# Patient Record
Sex: Male | Born: 1970 | Race: White | Hispanic: No | Marital: Married | Smoking: Former smoker
Health system: Southern US, Community
[De-identification: ages and names within clinical notes are randomized; demographics above are authoritative.]

## PROBLEM LIST (undated history)

## (undated) DIAGNOSIS — R112 Nausea with vomiting, unspecified: Secondary | ICD-10-CM

## (undated) DIAGNOSIS — Z9889 Other specified postprocedural states: Secondary | ICD-10-CM

## (undated) DIAGNOSIS — N189 Chronic kidney disease, unspecified: Secondary | ICD-10-CM

---

## 1997-02-07 HISTORY — PX: WISDOM TOOTH EXTRACTION: SHX21

## 2002-02-07 HISTORY — PX: OTHER SURGICAL HISTORY: SHX169

## 2006-02-07 HISTORY — PX: APPENDECTOMY: SHX54

## 2007-02-08 DIAGNOSIS — N189 Chronic kidney disease, unspecified: Secondary | ICD-10-CM

## 2007-02-08 HISTORY — PX: LITHOTRIPSY: SUR834

## 2007-02-08 HISTORY — DX: Chronic kidney disease, unspecified: N18.9

## 2007-02-08 HISTORY — PX: CYSTOSCOPY/RETROGRADE/URETEROSCOPY/STONE EXTRACTION WITH BASKET: SHX5317

## 2009-12-10 ENCOUNTER — Encounter: Admission: RE | Admit: 2009-12-10 | Discharge: 2009-12-10 | Payer: Self-pay | Admitting: Gastroenterology

## 2011-06-06 IMAGING — CT CT ABD-PELV W/ CM
2 of 4 series · 10 of 36 positions shown, 17 images · IV contrast (READICAT/WATER & [ID] OMNI 300)
Comparison: None.

CLINICAL DATA: Left upper quadrant abdominal pain, nausea,
decreased appetite, history of kidney stones

CT ABDOMEN AND PELVIS WITH CONTRAST
TECHNIQUE: Multidetector CT imaging of the abdomen and pelvis was
performed following the standard protocol during bolus
administration of intravenous contrast.
Contrast: 100 ml Mmnipaque-RXX

[Series 3: routine abdomen · axial · 0.66mm/px · z∈[-371,-61]mm · 9 of 78 slices shown, 15 images]
[im 8/78  soft-tissue]
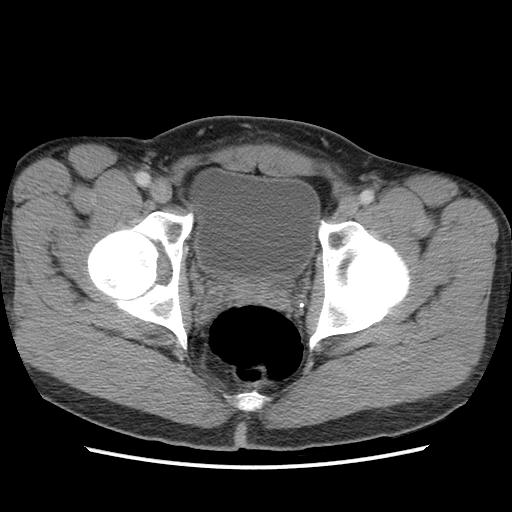
[im 8/78  bone]
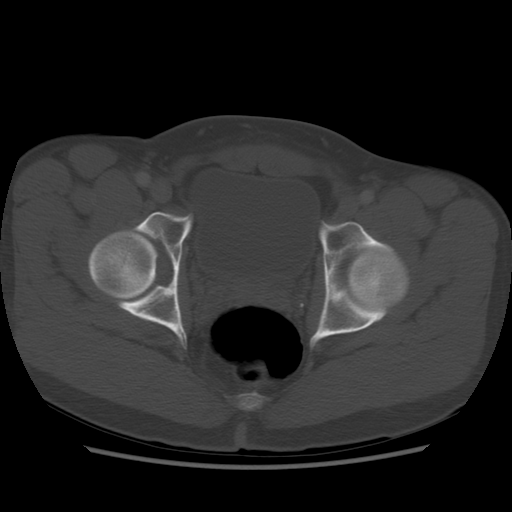
[im 16/78  soft-tissue]
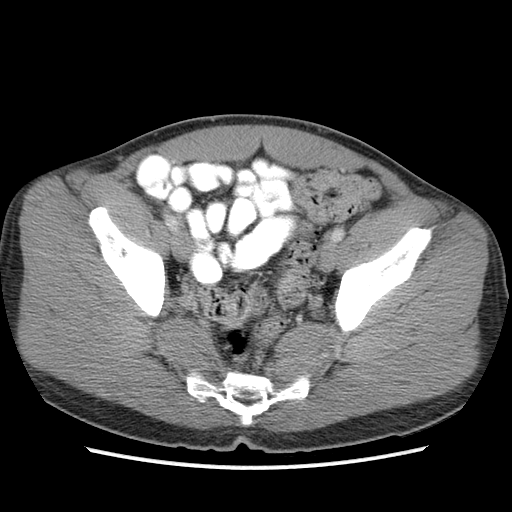
[im 24/78  soft-tissue]
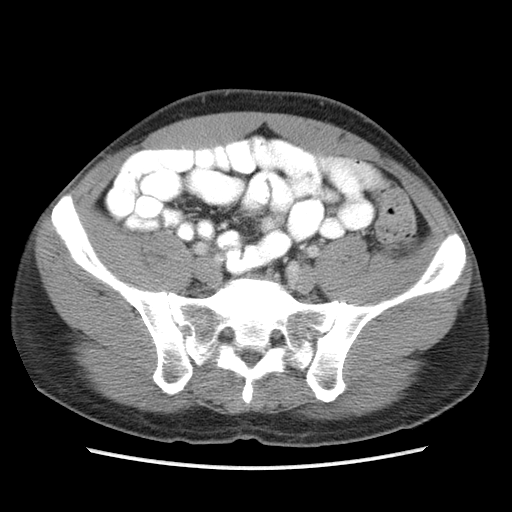
[im 31/78  soft-tissue]
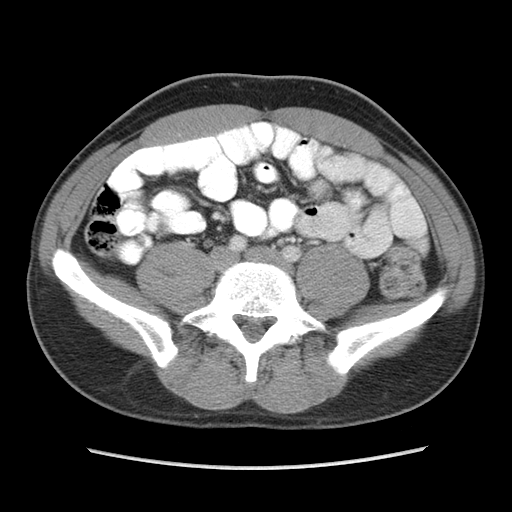
[im 39/78  soft-tissue]
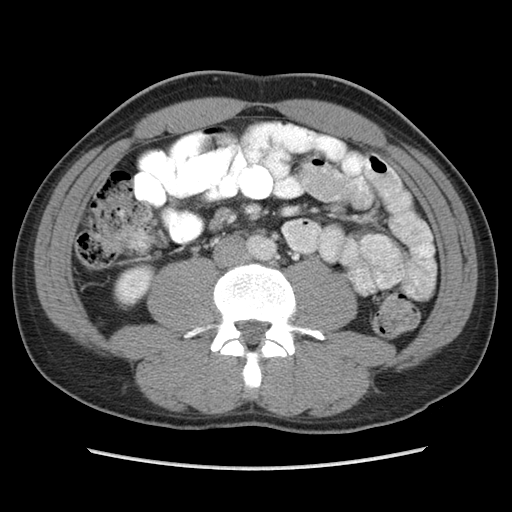
[im 47/78  soft-tissue]
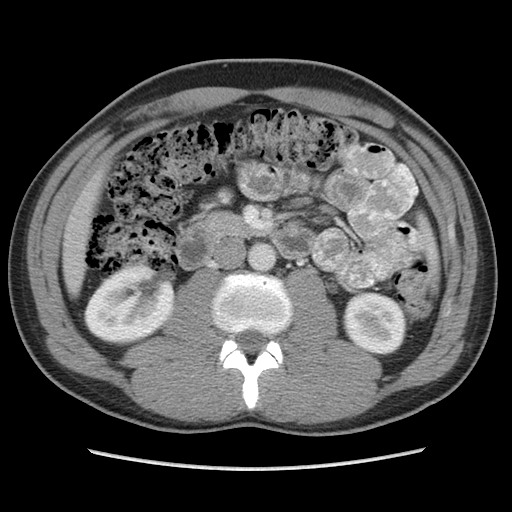
[im 47/78  lung]
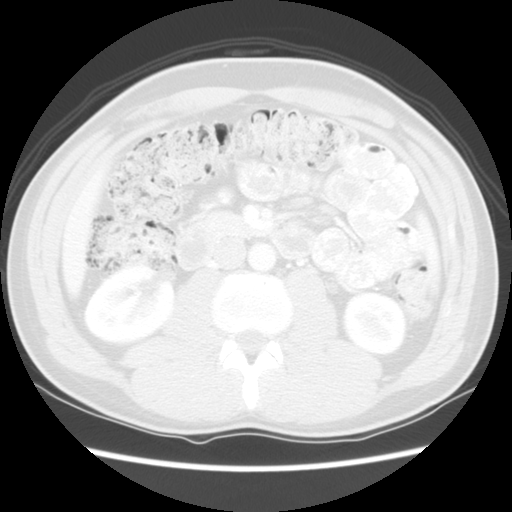
[im 54/78  soft-tissue]
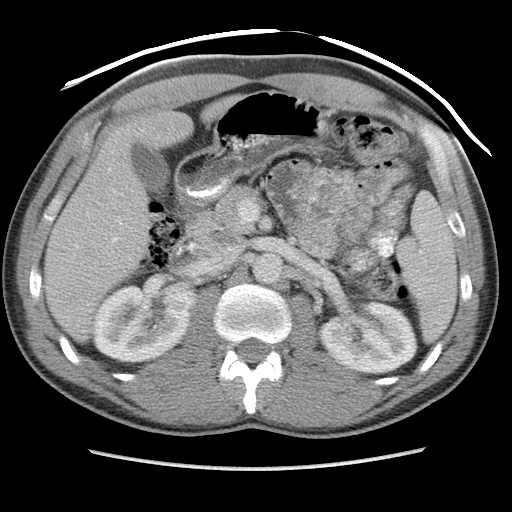
[im 54/78  lung]
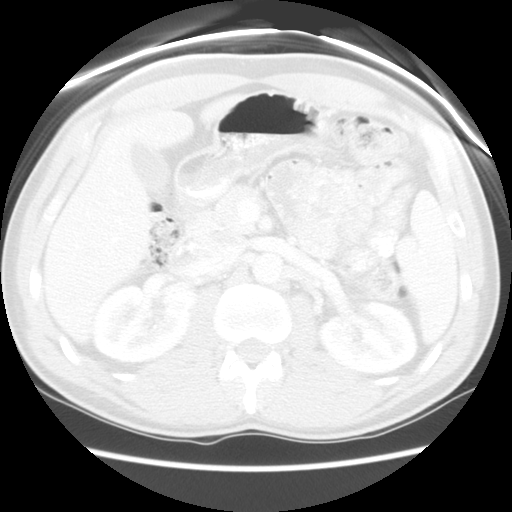
[im 62/78  soft-tissue]
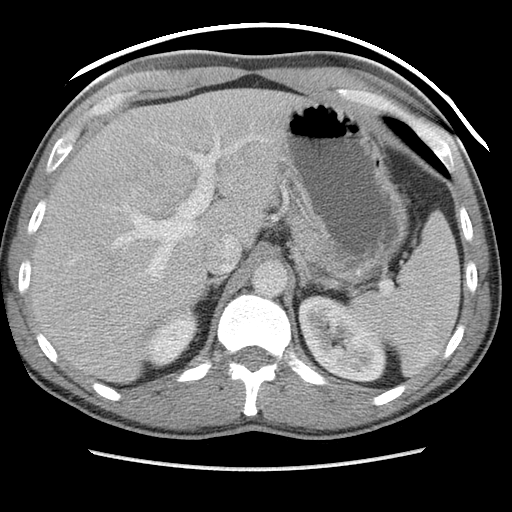
[im 62/78  lung]
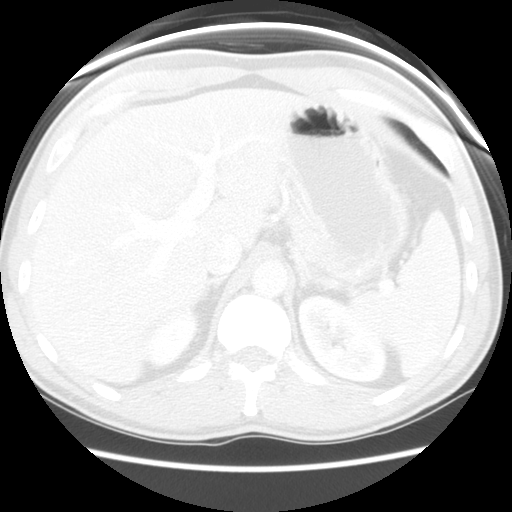
[im 70/78  soft-tissue]
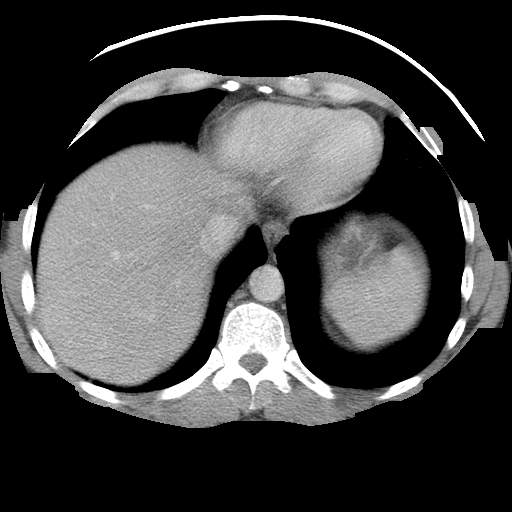
[im 70/78  lung]
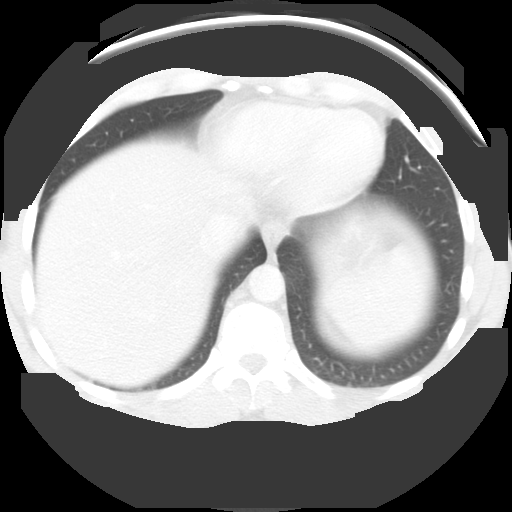
[im 70/78  bone]
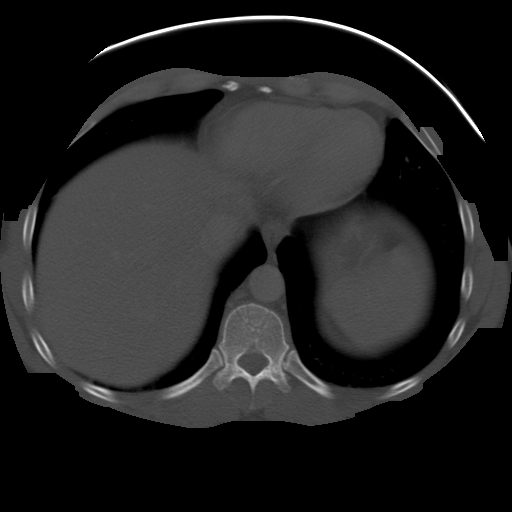

[Series 601: coronal body · coronal · 0.88mm/px · 1 of 112 slices shown, 2 images]
[im 38/112  soft-tissue]
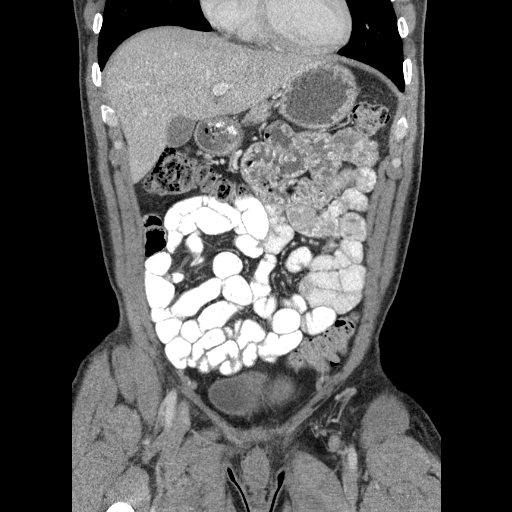
[im 38/112  bone]
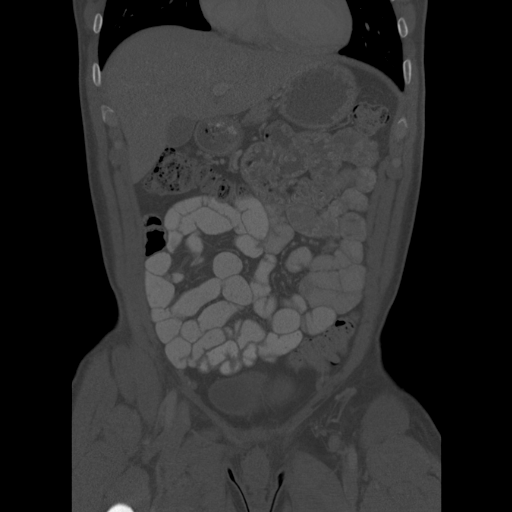

[10 of 36 positions shown; findings below may reference images not displayed]

FINDINGS: The lung bases are clear.  The liver enhances with no
focal abnormality and no ductal dilatation is seen.  The
gallbladder is visualized and no calcified gallstones are noted.
The pancreas is normal in size and the pancreatic duct is not
dilated.  The adrenal glands and spleen are unremarkable.  The
stomach is fluid distended and unremarkable.  The kidneys enhance
and there are small bilateral renal calculi which are
nonobstructing.  The largest of these calculi measures
approximately 5 mm.  The abdominal aorta is normal in caliber.  No
adenopathy is seen.

The ureters are normal in caliber and no distal ureteral calculi
are seen.  Urinary bladder is unremarkable.  The prostate is normal
in size.  No free fluid is seen within the pelvis.  There is feces
throughout the entire colon.  The terminal ileum is unremarkable
and there are sutures at the base of the cecum consistent with
prior appendectomy.  No bony abnormality is seen.
IMPRESSION: 1.  Small nonobstructing bilateral renal calculi.
2.  Moderate amount of feces throughout the colon.

## 2011-07-12 ENCOUNTER — Other Ambulatory Visit: Payer: Self-pay | Admitting: Orthopedic Surgery

## 2011-07-12 NOTE — Progress Notes (Signed)
Preoperative surgical orders have been place into the Epic hospital system for [facility] on 07/12/2011, 1:01 PM  by Patrica Duel for surgery on 08/24/2011.  Preop Knee orders including IV Tylenol, and IV Decadron as long as there are no contraindications to the above medications.  Avel Peace, PA-C

## 2011-07-13 ENCOUNTER — Ambulatory Visit (HOSPITAL_BASED_OUTPATIENT_CLINIC_OR_DEPARTMENT_OTHER)
Admission: RE | Admit: 2011-07-13 | Payer: Managed Care, Other (non HMO) | Source: Ambulatory Visit | Admitting: Orthopedic Surgery

## 2011-07-13 ENCOUNTER — Encounter (HOSPITAL_BASED_OUTPATIENT_CLINIC_OR_DEPARTMENT_OTHER): Admission: RE | Payer: Self-pay | Source: Ambulatory Visit

## 2011-07-13 SURGERY — ARTHROSCOPY, KNEE
Anesthesia: Choice | Laterality: Right

## 2011-08-16 ENCOUNTER — Encounter (HOSPITAL_COMMUNITY): Payer: Self-pay | Admitting: Pharmacy Technician

## 2011-08-22 ENCOUNTER — Other Ambulatory Visit: Payer: Self-pay | Admitting: Orthopedic Surgery

## 2011-08-22 ENCOUNTER — Other Ambulatory Visit (HOSPITAL_COMMUNITY): Payer: Managed Care, Other (non HMO)

## 2011-08-22 ENCOUNTER — Encounter (HOSPITAL_COMMUNITY): Payer: Self-pay

## 2011-08-22 ENCOUNTER — Encounter (HOSPITAL_COMMUNITY)
Admission: RE | Admit: 2011-08-22 | Discharge: 2011-08-22 | Disposition: A | Discharge status: Home / Self Care | Payer: Managed Care, Other (non HMO) | Source: Ambulatory Visit | Attending: Orthopedic Surgery | Admitting: Orthopedic Surgery

## 2011-08-22 HISTORY — DX: Chronic kidney disease, unspecified: N18.9

## 2011-08-22 HISTORY — DX: Nausea with vomiting, unspecified: R11.2

## 2011-08-22 HISTORY — DX: Other specified postprocedural states: Z98.890

## 2011-08-22 LAB — CBC
MCH: 30.5 pg (ref 26.0–34.0)
MCHC: 35.4 g/dL (ref 30.0–36.0)
Platelets: 185 10*3/uL (ref 150–400)
RDW: 12.2 % (ref 11.5–15.5)

## 2011-08-22 LAB — SURGICAL PCR SCREEN: MRSA, PCR: NEGATIVE

## 2011-08-22 NOTE — Patient Instructions (Signed)
20 [name]  08/22/2011   Your procedure is scheduled on:  Wednesday 08/24/2011 at 1 pm  Report to [facility] at 1030 AM.  Call this number if you have problems the morning of surgery: [phone]   Remember:   Do not eat food:After Midnight.  May have clear liquids:until Midnight .  Clear liquids include soda, tea, black coffee, apple or grape juice, broth.  Take these medicines the morning of surgery with A SIP OF WATER: none   Do not wear jewelry  Do not wear lotions, powders, or perfumes.   Do not shave 48 hours prior to surgery. Men may shave face and neck.  Do not bring valuables to the hospital.  Contacts, dentures or bridgework may not be worn into surgery.       Patients discharged the day of surgery will not be allowed to drive home.  Name and phone number of your driver: Audley Hose [phone]  Special Instructions: CHG Shower Use Special Wash: 1/2 bottle night before surgery and 1/2 bottle morning of surgery.   Please read over the following fact sheets that you were given: MRSA Information, Sleep apnea sheet, Incentive Spirometry sheet                  If you have any questions, please call Telford Nab.Georgeanna Lea, Charity fundraiser, BSN at [phone]

## 2011-08-22 NOTE — Pre-Procedure Instructions (Signed)
Reviewed pre-op instructions with patient using Teach back method. 

## 2011-08-24 ENCOUNTER — Encounter (HOSPITAL_COMMUNITY): Payer: Self-pay | Admitting: Anesthesiology

## 2011-08-24 ENCOUNTER — Encounter (HOSPITAL_COMMUNITY)
Admission: RE | Disposition: A | Discharge status: Home / Self Care | Payer: Self-pay | Source: Ambulatory Visit | Attending: Orthopedic Surgery

## 2011-08-24 ENCOUNTER — Ambulatory Visit (HOSPITAL_COMMUNITY)
Admission: RE | Admit: 2011-08-24 | Discharge: 2011-08-24 | Disposition: A | Discharge status: Home / Self Care | Payer: Managed Care, Other (non HMO) | Source: Ambulatory Visit | Attending: Orthopedic Surgery | Admitting: Orthopedic Surgery

## 2011-08-24 ENCOUNTER — Ambulatory Visit (HOSPITAL_COMMUNITY): Payer: Managed Care, Other (non HMO) | Admitting: Anesthesiology

## 2011-08-24 ENCOUNTER — Encounter (HOSPITAL_COMMUNITY): Payer: Self-pay | Admitting: *Deleted

## 2011-08-24 DIAGNOSIS — X58XXXA Exposure to other specified factors, initial encounter: Secondary | ICD-10-CM | POA: Insufficient documentation

## 2011-08-24 DIAGNOSIS — Z79899 Other long term (current) drug therapy: Secondary | ICD-10-CM | POA: Insufficient documentation

## 2011-08-24 DIAGNOSIS — Z01812 Encounter for preprocedural laboratory examination: Secondary | ICD-10-CM | POA: Insufficient documentation

## 2011-08-24 DIAGNOSIS — S83289A Other tear of lateral meniscus, current injury, unspecified knee, initial encounter: Secondary | ICD-10-CM | POA: Diagnosis present

## 2011-08-24 HISTORY — PX: KNEE ARTHROSCOPY: SHX127

## 2011-08-24 SURGERY — ARTHROSCOPY, KNEE
Anesthesia: General | Site: Knee | Laterality: Right | Wound class: Clean

## 2011-08-24 MED ORDER — OXYCODONE HCL 5 MG PO TABS: 5.0000 mg | ORAL_TABLET | ORAL | Status: AC | PRN

## 2011-08-24 MED ORDER — CEFAZOLIN SODIUM-DEXTROSE 2-3 GM-% IV SOLR
2.0000 g | INTRAVENOUS | Status: AC
  Administered 2011-08-24: 2 g via INTRAVENOUS

## 2011-08-24 MED ORDER — DEXAMETHASONE SODIUM PHOSPHATE 10 MG/ML IJ SOLN: 10.0000 mg | Freq: Once | INTRAMUSCULAR | Status: DC

## 2011-08-24 MED ORDER — MIDAZOLAM HCL 5 MG/5ML IJ SOLN
INTRAMUSCULAR | Status: DC | PRN
  Administered 2011-08-24: 2 mg via INTRAVENOUS

## 2011-08-24 MED ORDER — BUPIVACAINE-EPINEPHRINE PF 0.25-1:200000 % IJ SOLN
INTRAMUSCULAR | Status: AC
  Filled 2011-08-24: qty 30

## 2011-08-24 MED ORDER — LACTATED RINGERS IV SOLN
INTRAVENOUS | Status: DC | PRN
  Administered 2011-08-24 (×2): via INTRAVENOUS

## 2011-08-24 MED ORDER — SODIUM CHLORIDE 0.9 % IV SOLN: INTRAVENOUS | Status: DC

## 2011-08-24 MED ORDER — ONDANSETRON HCL 4 MG/2ML IJ SOLN
INTRAMUSCULAR | Status: DC | PRN
  Administered 2011-08-24: 4 mg via INTRAVENOUS

## 2011-08-24 MED ORDER — METHOCARBAMOL 500 MG PO TABS: 500.0000 mg | ORAL_TABLET | Freq: Four times a day (QID) | ORAL | Status: AC

## 2011-08-24 MED ORDER — METHOCARBAMOL 500 MG PO TABS
500.0000 mg | ORAL_TABLET | Freq: Once | ORAL | Status: AC
  Administered 2011-08-24: 500 mg via ORAL

## 2011-08-24 MED ORDER — FENTANYL CITRATE 0.05 MG/ML IJ SOLN
INTRAMUSCULAR | Status: DC | PRN
  Administered 2011-08-24 (×2): 50 ug via INTRAVENOUS

## 2011-08-24 MED ORDER — FENTANYL CITRATE 0.05 MG/ML IJ SOLN
INTRAMUSCULAR | Status: AC
  Filled 2011-08-24: qty 2

## 2011-08-24 MED ORDER — LACTATED RINGERS IR SOLN
Status: DC | PRN
  Administered 2011-08-24: 9000 mL

## 2011-08-24 MED ORDER — LIDOCAINE HCL 1 % IJ SOLN
INTRAMUSCULAR | Status: DC | PRN
  Administered 2011-08-24: 80 mg via INTRADERMAL

## 2011-08-24 MED ORDER — ACETAMINOPHEN 10 MG/ML IV SOLN
INTRAVENOUS | Status: AC
  Filled 2011-08-24: qty 100

## 2011-08-24 MED ORDER — PROMETHAZINE HCL 25 MG/ML IJ SOLN: 6.2500 mg | INTRAMUSCULAR | Status: DC | PRN

## 2011-08-24 MED ORDER — BUPIVACAINE-EPINEPHRINE 0.25% -1:200000 IJ SOLN
INTRAMUSCULAR | Status: DC | PRN
  Administered 2011-08-24: 20 mL

## 2011-08-24 MED ORDER — CHLORHEXIDINE GLUCONATE 4 % EX LIQD
60.0000 mL | Freq: Once | CUTANEOUS | Status: DC
Start: 2011-08-24 — End: 2011-08-24
  Filled 2011-08-24: qty 60

## 2011-08-24 MED ORDER — OXYCODONE HCL 5 MG PO TABS
5.0000 mg | ORAL_TABLET | Freq: Once | ORAL | Status: AC
  Administered 2011-08-24: 5 mg via ORAL

## 2011-08-24 MED ORDER — CEFAZOLIN SODIUM-DEXTROSE 2-3 GM-% IV SOLR
INTRAVENOUS | Status: AC
  Filled 2011-08-24: qty 50

## 2011-08-24 MED ORDER — DEXAMETHASONE SODIUM PHOSPHATE 10 MG/ML IJ SOLN
INTRAMUSCULAR | Status: DC | PRN
  Administered 2011-08-24: 10 mg via INTRAVENOUS

## 2011-08-24 MED ORDER — METHOCARBAMOL 500 MG PO TABS
ORAL_TABLET | ORAL | Status: AC
  Administered 2011-08-24: 500 mg via ORAL
  Filled 2011-08-24: qty 1

## 2011-08-24 MED ORDER — OXYCODONE HCL 5 MG PO TABS
ORAL_TABLET | ORAL | Status: AC
  Administered 2011-08-24: 5 mg via ORAL
  Filled 2011-08-24: qty 1

## 2011-08-24 MED ORDER — PROPOFOL 10 MG/ML IV EMUL
INTRAVENOUS | Status: DC | PRN
  Administered 2011-08-24: 150 mg via INTRAVENOUS
  Administered 2011-08-24: 50 mg via INTRAVENOUS

## 2011-08-24 MED ORDER — ACETAMINOPHEN 10 MG/ML IV SOLN
1000.0000 mg | Freq: Once | INTRAVENOUS | Status: AC
  Administered 2011-08-24: 1000 mg via INTRAVENOUS

## 2011-08-24 MED ORDER — FENTANYL CITRATE 0.05 MG/ML IJ SOLN
25.0000 ug | INTRAMUSCULAR | Status: DC | PRN
  Administered 2011-08-24 (×2): 50 ug via INTRAVENOUS

## 2011-08-24 SURGICAL SUPPLY — 23 items
BANDAGE ELASTIC 6 VELCRO ST LF (GAUZE/BANDAGES/DRESSINGS) ×2 IMPLANT
BLADE 4.2CUDA (BLADE) ×2 IMPLANT
CLOTH BEACON ORANGE TIMEOUT ST (SAFETY) ×2 IMPLANT
CUFF TOURN SGL QUICK 34 (TOURNIQUET CUFF) ×1
CUFF TRNQT CYL 34X4X40X1 (TOURNIQUET CUFF) ×1 IMPLANT
DRSG EMULSION OIL 3X3 NADH (GAUZE/BANDAGES/DRESSINGS) ×2 IMPLANT
DURAPREP 26ML APPLICATOR (WOUND CARE) ×2 IMPLANT
GLOVE BIO SURGEON STRL SZ8 (GLOVE) ×2 IMPLANT
GLOVE BIOGEL PI IND STRL 8 (GLOVE) ×1 IMPLANT
GLOVE BIOGEL PI INDICATOR 8 (GLOVE) ×1
GOWN STRL NON-REIN LRG LVL3 (GOWN DISPOSABLE) ×2 IMPLANT
IV LACTATED RINGER IRRG 3000ML (IV SOLUTION) ×3
IV LR IRRIG 3000ML ARTHROMATIC (IV SOLUTION) ×3 IMPLANT
MANIFOLD NEPTUNE II (INSTRUMENTS) ×2 IMPLANT
PACK ARTHROSCOPY WL (CUSTOM PROCEDURE TRAY) ×2 IMPLANT
PADDING CAST COTTON 6X4 STRL (CAST SUPPLIES) ×2 IMPLANT
POSITIONER SURGICAL ARM (MISCELLANEOUS) ×2 IMPLANT
SET ARTHROSCOPY TUBING (MISCELLANEOUS) ×1
SET ARTHROSCOPY TUBING LN (MISCELLANEOUS) ×1 IMPLANT
SUT ETHILON 4 0 PS 2 18 (SUTURE) ×2 IMPLANT
TOWEL OR 17X26 10 PK STRL BLUE (TOWEL DISPOSABLE) ×2 IMPLANT
WAND 90 DEG TURBOVAC W/CORD (SURGICAL WAND) ×2 IMPLANT
WRAP KNEE MAXI GEL POST OP (GAUZE/BANDAGES/DRESSINGS) ×2 IMPLANT

## 2011-08-24 NOTE — Anesthesia Preprocedure Evaluation (Addendum)
Anesthesia Evaluation  Patient identified by MRN, date of birth, ID band Patient awake    Reviewed: Allergy & Precautions, H&P , NPO status , Patient's Chart, lab work & pertinent test results, reviewed documented beta blocker date and time   History of Anesthesia Complications (+) PONV  Airway Mallampati: II TM Distance: >3 FB Neck ROM: full    Dental No notable dental hx.    Pulmonary neg pulmonary ROS,  breath sounds clear to auscultation  Pulmonary exam normal       Cardiovascular Exercise Tolerance: Good negative cardio ROS  Rhythm:regular Rate:Normal     Neuro/Psych negative neurological ROS  negative psych ROS   GI/Hepatic negative GI ROS, Neg liver ROS,   Endo/Other  negative endocrine ROS  Renal/GU negative Renal ROS  negative genitourinary   Musculoskeletal   Abdominal   Peds  Hematology negative hematology ROS (+)   Anesthesia Other Findings   Reproductive/Obstetrics negative OB ROS                           Anesthesia Physical Anesthesia Plan  ASA: II  Anesthesia Plan: General LMA   Post-op Pain Management:    Induction:   Airway Management Planned:   Additional Equipment:   Intra-op Plan:   Post-operative Plan:   Informed Consent: I have reviewed the patients History and Physical, chart, labs and discussed the procedure including the risks, benefits and alternatives for the proposed anesthesia with the patient or authorized representative who has indicated his/her understanding and acceptance.   Dental Advisory Given  Plan Discussed with: CRNA  Anesthesia Plan Comments:         Anesthesia Quick Evaluation

## 2011-08-24 NOTE — Preoperative (Signed)
Beta Blockers   Reason not to administer Beta Blockers:Not Applicable 

## 2011-08-24 NOTE — Interval H&P Note (Signed)
History and Physical Interval Note:  08/24/2011 1:22 PM  Raymond Martinez  has presented today for surgery, with the diagnosis of right knee lateral meniscal tear   The various methods of treatment have been discussed with the patient and family. After consideration of risks, benefits and other options for treatment, the patient has consented to  Procedure(s) (LRB): ARTHROSCOPY KNEE (Right) as a surgical intervention .  The patient's history has been reviewed, patient examined, no change in status, stable for surgery.  I have reviewed the patients' chart and labs.  Questions were answered to the patient's satisfaction.     Loanne Drilling

## 2011-08-24 NOTE — Anesthesia Postprocedure Evaluation (Signed)
  Anesthesia Post-op Note  Patient: Raymond Martinez  Procedure(s) Performed: Procedure(s) (LRB): ARTHROSCOPY KNEE (Right)  Patient Location: PACU  Anesthesia Type: General  Level of Consciousness: awake and alert   Airway and Oxygen Therapy: Patient Spontanous Breathing  Post-op Pain: mild  Post-op Assessment: Post-op Vital signs reviewed, Patient's Cardiovascular Status Stable, Respiratory Function Stable, Patent Airway and No signs of Nausea or vomiting  Post-op Vital Signs: stable  Complications: No apparent anesthesia complications

## 2011-08-24 NOTE — Transfer of Care (Signed)
Immediate Anesthesia Transfer of Care Note  Patient: Raymond Martinez  Procedure(s) Performed: Procedure(s) (LRB): ARTHROSCOPY KNEE (Right)  Patient Location: PACU  Anesthesia Type: General  Level of Consciousness: awake, alert , oriented, patient cooperative and responds to stimulation  Airway & Oxygen Therapy: Patient Spontanous Breathing and Patient connected to face mask oxygen  Post-op Assessment: Report given to PACU RN, Post -op Vital signs reviewed and stable and Patient moving all extremities  Post vital signs: Reviewed and stable  Complications: No apparent anesthesia complications

## 2011-08-24 NOTE — H&P (Signed)
  CC- Raymond Martinez is a 41 y.o. male who presents with right knee pain.  HPI- . Knee Pain: Patient presents with knee pain involving the  right knee. Onset of the symptoms was several years ago. Inciting event: none known. Current symptoms include giving out, pain located medial and lateral and stiffness. Pain is aggravated by rising after sitting, squatting, standing and walking.  Patient has had no prior knee problems. Evaluation to date: MRI: abnormal lateral meniscal tear with meniscal cyst. He presents now for arthroscopy and debridement  Past Medical History  Diagnosis Date  . PONV (postoperative nausea and vomiting)   . Chronic kidney disease 2009     kidney stones    Past Surgical History  Procedure Date  . Right shoulder surgery 2004  . Appendectomy 2008  . Wisdom tooth extraction 1999    2 upper   . Lithotripsy 2009    kidney stones  . Cystoscopy/retrograde/ureteroscopy/stone extraction with basket 2009    Prior to Admission medications   Medication Sig Start Date End Date Taking? Authorizing Provider  Multiple Vitamin (MULTIVITAMIN WITH MINERALS) TABS Take 1 tablet by mouth daily.    Historical Provider, MD  naproxen sodium (ANAPROX) 220 MG tablet Take 440 mg by mouth 2 (two) times daily as needed. For pain    Historical Provider, MD   KNEE EXAM antalgic gait, soft tissue tenderness over medial and lateral joint lines, negative drawer sign, collateral ligaments intact, normal ipsilateral hip exam  Physical Examination: General appearance - alert, well appearing, and in no distress Mental status - alert, oriented to person, place, and time Chest - clear to auscultation, no wheezes, rales or rhonchi, symmetric air entry Heart - normal rate, regular rhythm, normal S1, S2, no murmurs, rubs, clicks or gallops Abdomen - soft, nontender, nondistended, no masses or organomegaly Neurological - alert, oriented, normal speech, no focal findings or movement disorder noted    Asessment/Plan--- rightt knee lateral meniscal tear- - Plan right knee arthroscopy with meniscal debridement. Procedure risks and potential comps discussed with patient who elects to proceed. Goals are decreased pain and increased function with a high likelihood of achieving both

## 2011-08-24 NOTE — Op Note (Signed)
Preoperative diagnosis-  Right knee lateral meniscal tear  Postoperative diagnosis Right- knee lateral meniscal tear   Procedure- Right knee arthroscopy with lateral Meniscal debridement    Surgeon- Gus Rankin. Kattaleya Alia, MD  Anesthesia-General  EBL-  minimal Complications- None  Condition- PACU - hemodynamically stable.  Brief clinical note- -Raymond Martinez is a 41 y.o.  male with a several month history of right knee pain and mechanical symptoms. Exam and history suggested lateral meniscal tear confirmed by MRI. The patient presents now for arthroscopy and debridement   Procedure in detail -       After successful administration of General anesthetic, a tourmiquet is placed high on the Right  thigh and the Right lower extremity is prepped and draped in the usual sterile fashion. Time out is performed by the surgical team. Standard superomedial and inferolateral portal sites are marked and incisions made with an 11 blade. The inflow cannula is passed through the superomedial portal and camera through the inferolateral portal and inflow is initiated. Arthroscopic visualization proceeds.      The undersurface of the patella and trochlea are visualized and there is mild chondromalacia of both surfaces but no unstable cartilage. The medial and lateral gutters are visualized and there are  no loose bodies. Flexion and valgus force is applied to the knee and the medial compartment is entered. A spinal needle is passed into the joint through the site marked for the inferomedial portal. A small incision is made and the dilator passed into the joint. The findings for the medial compartment are mild chondromalacia medial femoral condyle without any unstable lesions and a normal meniscus.     The intercondylar notch is visualized and the ACL appears normal. The lateral compartment is entered and the findings are unstable tear of the lateral meniscus body and anterior horn with an unstable fragment displaced  into the intercondylar notch. The tear is debrided to a stable base with baskets and a shaver and sealed off with the Arthrocare. The chondral surfaces are normal.     The joint is again inspected and there are no other tears, defects or loose bodies identified. The arthroscopic equipment is then removed from the inferior portals which are closed with interrupted 4-0 nylon. 20 ml of .25% Marcaine with epinephrine are injected through the inflow cannula and the cannula is then removed and the portal closed with nylon. The incisions are cleaned and dried and a bulky sterile dressing is applied. The patient is then awakened and transported to recovery in stable condition.   08/24/2011, 2:00 PM

## 2011-08-24 NOTE — Progress Notes (Signed)
Crutch walking instructions done per ortho tech

## 2011-08-25 ENCOUNTER — Encounter (HOSPITAL_COMMUNITY): Payer: Self-pay | Admitting: Orthopedic Surgery

## 2011-12-20 ENCOUNTER — Other Ambulatory Visit: Payer: Self-pay | Admitting: Physician Assistant

## 2011-12-20 DIAGNOSIS — N649 Disorder of breast, unspecified: Secondary | ICD-10-CM

## 2011-12-22 ENCOUNTER — Encounter: Payer: Self-pay | Admitting: Gastroenterology

## 2011-12-27 ENCOUNTER — Other Ambulatory Visit: Payer: Managed Care, Other (non HMO)

## 2011-12-28 ENCOUNTER — Ambulatory Visit
Admission: RE | Admit: 2011-12-28 | Discharge: 2011-12-28 | Disposition: A | Discharge status: Home / Self Care | Payer: Managed Care, Other (non HMO) | Source: Ambulatory Visit | Attending: Physician Assistant | Admitting: Physician Assistant

## 2011-12-28 DIAGNOSIS — N649 Disorder of breast, unspecified: Secondary | ICD-10-CM

## 2012-01-16 ENCOUNTER — Encounter: Payer: Self-pay | Admitting: Gastroenterology

## 2012-01-16 ENCOUNTER — Ambulatory Visit (INDEPENDENT_AMBULATORY_CARE_PROVIDER_SITE_OTHER): Payer: Managed Care, Other (non HMO) | Admitting: Gastroenterology

## 2012-01-16 VITALS — BP 116/80 | HR 68 | Ht 67.0 in | Wt 191.2 lb

## 2012-01-16 DIAGNOSIS — R109 Unspecified abdominal pain: Secondary | ICD-10-CM

## 2012-01-16 MED ORDER — PEG-KCL-NACL-NASULF-NA ASC-C 100 G PO SOLR: 1.0000 | Freq: Once | ORAL | Status: AC

## 2012-01-16 NOTE — Progress Notes (Signed)
HPI: This is a   very pleasant 41 year old man whom I am meeting for the first time today.  4-5 years of abd pains.  He always has pain.  "24/7".  Left sided under rib cage and also at left sided belt line.  Waxes and wanes.  Eating and moving his bowels have no relation to the pains.  No certain positions make it better or worse.  He has mild associated nausea, no vomiting. Overall weight has done up lately.  Takes alleve periodically.    Has had bilateral kidney stones.  But he "knows for a certainty" that these pains are not from kidney stones.    Saw Dr. Dulce Sellar 2 years ago, CT ordered and it was completely normal. Dr. Dulce Sellar explained that he didn't know what the pain was from and pt iddn't like hearing.   Tried antispasm meds without improvement.  Tried tramadol but he didn't like it.    Labs done by PCP recently were normal per patient.  Blood work dated 1-2 weeks ago shows normal CBC, normal complete metabolic profile, normal ANCA profile: Tissue transglutaminase as it was normal, low total IgA level, lipase was normal, sedimentation rate was normal.    Review of systems: Pertinent positive and negative review of systems were noted in the above HPI section. Complete review of systems was performed and was otherwise normal.    Past Medical History  Diagnosis Date  . PONV (postoperative nausea and vomiting)   . Chronic kidney disease 2009     kidney stones    Past Surgical History  Procedure Date  . Right shoulder surgery 2004  . Appendectomy 2008  . Wisdom tooth extraction 1999    2 upper   . Lithotripsy 2009    kidney stones  . Cystoscopy/retrograde/ureteroscopy/stone extraction with basket 2009  . Knee arthroscopy 08/24/2011    Procedure: ARTHROSCOPY KNEE;  Surgeon: Loanne Drilling, MD;  Location: [facility];  Service: Orthopedics;  Laterality: Right;  partial lateral meniscectomy, debridement    Current Outpatient Prescriptions  Medication Sig Dispense Refill  . Multiple  Vitamin (MULTIVITAMIN WITH MINERALS) TABS Take 1 tablet by mouth daily.      . naproxen sodium (ANAPROX) 220 MG tablet Take 440 mg by mouth 2 (two) times daily as needed. For pain        Allergies as of 01/16/2012 - Review Complete 01/16/2012  Allergen Reaction Noted  . Tramadol Other (See Comments) 08/16/2011    Family History  Problem Relation Age of Onset  . Diabetes Paternal Grandmother     History   Social History  . Marital Status: Married    Spouse Name: N/A    Number of Children: 1  . Years of Education: N/A   Occupational History  . Engineer    Social History Main Topics  . Smoking status: Former Games developer  . Smokeless tobacco: Never Used  . Alcohol Use: No     Comment: occassionally  . Drug Use: No     Comment: in late teens'  . Sexually Active: Not on file   Other Topics Concern  . Not on file   Social History Narrative  . No narrative on file       Physical Exam: BP 116/80  Pulse 68  Ht 5\' 7"  (1.702 m)  Wt 191 lb 3.2 oz (86.728 kg)  BMI 29.95 kg/m2 Constitutional: generally well-appearing Psychiatric: alert and oriented x3 Eyes: extraocular movements intact Mouth: oral pharynx moist, no lesions Neck: supple no lymphadenopathy  Cardiovascular: heart regular rate and rhythm Lungs: clear to auscultation bilaterally Abdomen: soft, mildly tender left upper quadrant , nondistended, no obvious ascites, no peritoneal signs, normal bowel sounds Extremities: no lower extremity edema bilaterally Skin: no lesions on visible extremities    Assessment and plan: 41 y.o. male with  several years of left-sided abdominal pain  I am not sure that his abdominal pain is gastrointestinal related. Indeed he had a CAT scan 2 years ago that was normal. He has had basic lab workup including CBC, complete metabolic profile, sedimentation rate that was all normal as well. His pains are completely unrelated to the or moving his bowels. He never sees blood in his stool.  Given the nature of his pains, on the left side, and his expectations I think we should proceed with colonoscopy at his soonest convenience. I did explain to him that I might have to put causes pains either. He was unhappy with that same final conclusion by another gastroenterologist 2 years ago.

## 2012-01-16 NOTE — Patient Instructions (Addendum)
You will be set up for a colonoscopy (LEC,  Moderate sedation) for left sided abdominal pains. A copy of this information will be made available to Dr. Tommi Emery.Marland Kitchen

## 2012-01-23 ENCOUNTER — Other Ambulatory Visit: Payer: Managed Care, Other (non HMO) | Admitting: Gastroenterology

## 2017-01-13 ENCOUNTER — Encounter: Payer: Self-pay | Admitting: Rehabilitative and Restorative Service Providers"

## 2017-01-13 ENCOUNTER — Ambulatory Visit
Payer: BLUE CROSS/BLUE SHIELD | Attending: Family Medicine | Admitting: Rehabilitative and Restorative Service Providers"

## 2017-01-13 DIAGNOSIS — R42 Dizziness and giddiness: Secondary | ICD-10-CM | POA: Insufficient documentation

## 2017-01-13 NOTE — Therapy (Signed)
[facility] [address] [place], [place], [address] Phone: [phone]   Fax:  [phone]  Physical Therapy Consult Note-- No evaluation performed  Patient Details  Name: Raymond Martinez MRN: [id number] Date of Birth: 07/06/1970 No Data Recorded  Encounter Date: 01/13/2017    Past Medical History:  Diagnosis Date  . Chronic kidney disease 2009    kidney stones  . PONV (postoperative nausea and vomiting)     Past Surgical History:  Procedure Laterality Date  . APPENDECTOMY  2008  . CYSTOSCOPY/RETROGRADE/URETEROSCOPY/STONE EXTRACTION WITH BASKET  2009  . KNEE ARTHROSCOPY  08/24/2011   Procedure: ARTHROSCOPY KNEE;  Surgeon: Gearlean Alf, MD;  Location: [facility];  Service: Orthopedics;  Laterality: Right;  partial lateral meniscectomy, debridement  . LITHOTRIPSY  2009   kidney stones  . right shoulder surgery  2004  . [place]   2 upper     There were no vitals filed for this visit.   Subjective Assessment - 01/13/17 1454    Subjective  The patient reports sudden onset of vertigo 01/03/2017.  Patient was working underneath the nose of the airplane and looked up feeling an intoxicated sensation "like groggy", not spinning.  This sensation lasted for a day and then progressively tapered off over the past few days.  He took meclizine for a few days and stopped taking yesterday. He has not had symptoms x 2 days.  He reports he is back to his baseline level of mobility.      Patient Stated Goals  "Make sure it doesn't happen again".  He has been working without difficulty     Currently in Pain?  No/denies        Plan - 01/13/17 1511    Clinical Impression Statement  The patient reports full resolution of symptoms.  PT provided 5 minutes of education.  We did not complete evaluation today due to resolution of symptoms.  Recommended patient call our office if symptoms return-- referral in our  system.     Consulted and Agree with Plan of Care  Patient       Patient will benefit from skilled therapeutic intervention in order to improve the following deficits and impairments:     Visit Diagnosis: Dizziness and giddiness     Problem List Patient Active Problem List   Diagnosis Date Noted  . Lateral meniscal tear 08/24/2011    Cara Thaxton, PT 01/13/2017, 3:14 PM  [facility] [address] [place], [place], [address] Phone: [phone]   Fax:  [phone]  Name: Raymond Martinez MRN: [id number] Date of Birth: 1970-02-27

## 2019-04-14 ENCOUNTER — Ambulatory Visit: Payer: BLUE CROSS/BLUE SHIELD | Attending: Internal Medicine

## 2019-04-14 DIAGNOSIS — Z23 Encounter for immunization: Secondary | ICD-10-CM | POA: Insufficient documentation

## 2019-04-14 NOTE — Progress Notes (Signed)
   Covid-19 Vaccination Clinic  Name:  Raymond Martinez    MRN: [id number] DOB: Apr 09, 1970  04/14/2019  Raymond Martinez was observed post Covid-19 immunization for 15 minutes without incident. He was provided with Vaccine Information Sheet and instruction to access the V-Safe system.   Raymond Martinez was instructed to call 911 with any severe reactions post vaccine: Marland Kitchen Difficulty breathing  . Swelling of face and throat  . A fast heartbeat  . A bad rash all over body  . Dizziness and weakness   Immunizations Administered    Name Date Dose VIS Date Route   Pfizer COVID-19 Vaccine 04/14/2019 10:21 AM 0.3 mL 01/18/2019 Intramuscular   Manufacturer: [place]   Lot: [id number]   [place]: [id number]

## 2019-05-14 ENCOUNTER — Ambulatory Visit: Payer: BLUE CROSS/BLUE SHIELD | Attending: Internal Medicine

## 2019-05-14 DIAGNOSIS — Z23 Encounter for immunization: Secondary | ICD-10-CM

## 2019-05-14 NOTE — Progress Notes (Signed)
   Covid-19 Vaccination Clinic  Name:  Mikael Skoda    MRN: [id number] DOB: 1970/11/13  05/14/2019  Mr. O'Neal was observed post Covid-19 immunization for 15 minutes without incident. He was provided with Vaccine Information Sheet and instruction to access the V-Safe system.   Mr. Hoeffner was instructed to call 911 with any severe reactions post vaccine: Marland Kitchen Difficulty breathing  . Swelling of face and throat  . A fast heartbeat  . A bad rash all over body  . Dizziness and weakness   Immunizations Administered    Name Date Dose VIS Date Route   Pfizer COVID-19 Vaccine 05/14/2019  1:43 PM 0.3 mL 01/18/2019 Intramuscular   Manufacturer: [employer]   Lot: [id number]   [place]: [id number]
